# Patient Record
Sex: Male | Born: 1990 | Race: White | Hispanic: No | Marital: Single | Smoking: Never smoker
Health system: Southern US, Community
[De-identification: ages and names within clinical notes are randomized; demographics above are authoritative.]

## PROBLEM LIST (undated history)

## (undated) DIAGNOSIS — S1320XA Dislocation of unspecified parts of neck, initial encounter: Secondary | ICD-10-CM

## (undated) DIAGNOSIS — J45909 Unspecified asthma, uncomplicated: Secondary | ICD-10-CM

## (undated) DIAGNOSIS — F8081 Childhood onset fluency disorder: Secondary | ICD-10-CM

## (undated) HISTORY — PX: HERNIA REPAIR: SHX51

---

## 2012-11-03 ENCOUNTER — Encounter (HOSPITAL_BASED_OUTPATIENT_CLINIC_OR_DEPARTMENT_OTHER): Payer: Self-pay | Admitting: *Deleted

## 2012-11-03 ENCOUNTER — Emergency Department (HOSPITAL_BASED_OUTPATIENT_CLINIC_OR_DEPARTMENT_OTHER)
Admission: EM | Admit: 2012-11-03 | Discharge: 2012-11-03 | Disposition: A | Discharge status: Home / Self Care | Payer: Medicaid Other | Attending: Emergency Medicine | Admitting: Emergency Medicine

## 2012-11-03 ENCOUNTER — Emergency Department (HOSPITAL_BASED_OUTPATIENT_CLINIC_OR_DEPARTMENT_OTHER): Payer: Medicaid Other

## 2012-11-03 DIAGNOSIS — R42 Dizziness and giddiness: Secondary | ICD-10-CM | POA: Insufficient documentation

## 2012-11-03 DIAGNOSIS — R4789 Other speech disturbances: Secondary | ICD-10-CM | POA: Insufficient documentation

## 2012-11-03 DIAGNOSIS — Z880 Allergy status to penicillin: Secondary | ICD-10-CM | POA: Insufficient documentation

## 2012-11-03 DIAGNOSIS — J45909 Unspecified asthma, uncomplicated: Secondary | ICD-10-CM | POA: Insufficient documentation

## 2012-11-03 DIAGNOSIS — Z87828 Personal history of other (healed) physical injury and trauma: Secondary | ICD-10-CM | POA: Insufficient documentation

## 2012-11-03 DIAGNOSIS — Z8619 Personal history of other infectious and parasitic diseases: Secondary | ICD-10-CM | POA: Insufficient documentation

## 2012-11-03 DIAGNOSIS — M542 Cervicalgia: Secondary | ICD-10-CM | POA: Insufficient documentation

## 2012-11-03 DIAGNOSIS — Z79899 Other long term (current) drug therapy: Secondary | ICD-10-CM | POA: Insufficient documentation

## 2012-11-03 DIAGNOSIS — T675XXA Heat exhaustion, unspecified, initial encounter: Secondary | ICD-10-CM

## 2012-11-03 DIAGNOSIS — G43909 Migraine, unspecified, not intractable, without status migrainosus: Secondary | ICD-10-CM | POA: Insufficient documentation

## 2012-11-03 DIAGNOSIS — Y9389 Activity, other specified: Secondary | ICD-10-CM | POA: Insufficient documentation

## 2012-11-03 DIAGNOSIS — Y9289 Other specified places as the place of occurrence of the external cause: Secondary | ICD-10-CM | POA: Insufficient documentation

## 2012-11-03 DIAGNOSIS — R61 Generalized hyperhidrosis: Secondary | ICD-10-CM | POA: Insufficient documentation

## 2012-11-03 DIAGNOSIS — X30XXXA Exposure to excessive natural heat, initial encounter: Secondary | ICD-10-CM | POA: Insufficient documentation

## 2012-11-03 HISTORY — DX: Unspecified asthma, uncomplicated: J45.909

## 2012-11-03 HISTORY — DX: Childhood onset fluency disorder: F80.81

## 2012-11-03 HISTORY — DX: Dislocation of unspecified parts of neck, initial encounter: S13.20XA

## 2012-11-03 LAB — COMPREHENSIVE METABOLIC PANEL
ALT: 22 U/L (ref 0–53)
AST: 24 U/L (ref 0–37)
CO2: 27 mEq/L (ref 19–32)
Calcium: 10.6 mg/dL — ABNORMAL HIGH (ref 8.4–10.5)
Potassium: 3.3 mEq/L — ABNORMAL LOW (ref 3.5–5.1)
Sodium: 140 mEq/L (ref 135–145)
Total Protein: 8.6 g/dL — ABNORMAL HIGH (ref 6.0–8.3)

## 2012-11-03 LAB — URINE MICROSCOPIC-ADD ON

## 2012-11-03 LAB — URINALYSIS, ROUTINE W REFLEX MICROSCOPIC
Glucose, UA: NEGATIVE mg/dL
Hgb urine dipstick: NEGATIVE
Specific Gravity, Urine: 1.038 — ABNORMAL HIGH (ref 1.005–1.030)

## 2012-11-03 LAB — CBC WITH DIFFERENTIAL/PLATELET
Basophils Absolute: 0 10*3/uL (ref 0.0–0.1)
Eosinophils Absolute: 0.1 10*3/uL (ref 0.0–0.7)
Eosinophils Relative: 1 % (ref 0–5)
Lymphocytes Relative: 21 % (ref 12–46)
MCV: 88 fL (ref 78.0–100.0)
Neutrophils Relative %: 70 % (ref 43–77)
Platelets: 218 10*3/uL (ref 150–400)
RDW: 12.2 % (ref 11.5–15.5)
WBC: 7.2 10*3/uL (ref 4.0–10.5)

## 2012-11-03 MED ORDER — SODIUM CHLORIDE 0.9 % IV SOLN
INTRAVENOUS | Status: DC
  Administered 2012-11-03 (×2): via INTRAVENOUS

## 2012-11-03 MED ORDER — SODIUM CHLORIDE 0.9 % IV SOLN
1000.0000 mL | Freq: Once | INTRAVENOUS | Status: AC
  Administered 2012-11-03: 1000 mL via INTRAVENOUS

## 2012-11-03 NOTE — ED Notes (Signed)
Pt states he has been working outside today and ? Got too hot. +N/V. Hx neck dislocation 1 yr ago. Unable to keep PO's down.

## 2012-11-03 NOTE — ED Notes (Signed)
With vomitting and visual problems. Pt also reporting dehydration working outside. Pt last urinated today.

## 2012-11-03 NOTE — ED Provider Notes (Signed)
History     This chart was scribed for Allen Human, MD by Jiles Prows, ED Scribe. The patient was seen in room MH09/MH09 and the patient's care was started at 3:41 PM.  CSN: [id number] Arrival date & time 11/03/12  1421  Chief Complaint  Patient presents with  . Emesis   The history is provided by the patient and medical records. No language interpreter was used.   HPI Comments: Allen Hood is a 22 y.o. male with a h/o migraines, stuttering, asthma, and neck dislocation who presents to the Emergency Department complaining of sudden, moderate nausea and vomiting onset this afternoon.  He reports that he dislocated a vertebrae about a year ago in his neck.  He states that sometimes this still bothers him and it can cause headache, nausea, and vomiting.  He believes that this injury was aggravated today while working outside.  This was further aggravated by the heat.  His mother reports that he was vomiting and sweating profusely with associated dizziness and light headedness. Mother suspects dehydration.  Pt reportedly was provided with flexeril prescription on Friday but did not take it this weekend.  Mother reports he took this medication with a cheeseburger this afternoon after the episode began and was unable to keep it down.  She brought him to the ED after a few hours of this vomiting.  Pt and mother report he was staggering instead of walking straight after the vomiting incident.  Pt denies dysuria, rash, chest pain, headache, diaphoresis, fever, chills, diarrhea, weakness, cough, seizure, convulsion, SOB and any other pain.  Mother reports stuttering speech is baseline.  Mother reports that there has never been an x-ray or CT scan done on his neck. Pt reports he had a tick on his head 4 days ago and reports being treated for Lyme disease last year.  Pt reports h/o 2 hernia removal operations.  He denies smoking and alcohol use.  Pt has seen Dr. Daphane Shepherd at [facility] in  [place] for this issue. Pt reports 40 mg Relpax for migraine relief.  Mother states he uses two tablets to treat a migraine.  Past Medical History  Diagnosis Date  . Dislocation of neck   . Stuttering   . Asthma    Past Surgical History  Procedure Laterality Date  . Hernia repair     History reviewed. No pertinent family history. History  Substance Use Topics  . Smoking status: Never Smoker   . Smokeless tobacco: Not on file  . Alcohol Use: No    Review of Systems  HENT: Positive for neck pain.   Gastrointestinal: Positive for nausea and vomiting.  Neurological: Positive for dizziness and light-headedness.  All other systems reviewed and are negative.   Allergies  Peanuts; Penicillins; Shellfish allergy; and Suprax  Home Medications   Current Outpatient Rx  Name  Route  Sig  Dispense  Refill  . ALBUTEROL SULFATE HFA IN   Inhalation   Inhale into the lungs.         Marland Kitchen amphetamine-dextroamphetamine (ADDERALL XR) 30 MG 24 hr capsule   Oral   Take 30 mg by mouth every morning.         . cetirizine (ZYRTEC) 10 MG tablet   Oral   Take 10 mg by mouth daily.         . cyclobenzaprine (FLEXERIL) 10 MG tablet   Oral   Take 10 mg by mouth 3 (three) times daily as needed for muscle spasms.         Marland Kitchen  EPINEPHrine (EPIPEN IJ)   Injection   Inject as directed.         . montelukast (SINGULAIR) 10 MG tablet   Oral   Take 10 mg by mouth at bedtime.         Marland Kitchen omeprazole (PRILOSEC) 40 MG capsule   Oral   Take 40 mg by mouth daily.          BP 144/87  Pulse 98  Temp(Src) 98.7 F (37.1 C) (Oral)  Resp 20  Ht 6\' 3"  (1.905 m)  Wt 218 lb (98.884 kg)  BMI 27.25 kg/m2  SpO2 100%  Physical Exam  Nursing note and vitals reviewed. Constitutional: He is oriented to person, place, and time. He appears well-developed and well-nourished. No distress.  Notable stuttering speech which is baseline for him.  HENT:  Head: Normocephalic and atraumatic.  Tongue  dry.  Eyes: EOM are normal. Pupils are equal, round, and reactive to light.  Neck: Normal range of motion. Neck supple. No tracheal deviation present. No thyromegaly present.  Cardiovascular: Normal rate.  Exam reveals no gallop.   No murmur heard. Pulmonary/Chest: Effort normal and breath sounds normal. No respiratory distress.  Musculoskeletal: Normal range of motion.  Lymphadenopathy:    He has no cervical adenopathy.  Neurological: He is alert and oriented to person, place, and time.  Neurologically intact.  Skin: Skin is warm and dry.  Psychiatric: He has a normal mood and affect. His behavior is normal.   ED Course  Procedures (including critical care time) DIAGNOSTIC STUDIES: Oxygen Saturation is 100% on RA, normal by my interpretation.    COORDINATION OF CARE: 3:55 PM - Discussed ED treatment with pt at bedside including IV fluids, and x-ray of cervical spine and pt and family agree.  Discussed dehydration and low potassium in blood.  Labs Reviewed  COMPREHENSIVE METABOLIC PANEL - Abnormal; Notable for the following:    Potassium 3.3 (*)    Calcium 10.6 (*)    Total Protein 8.6 (*)    Albumin 5.4 (*)    GFR calc non Af Amer 86 (*)    All other components within normal limits  URINALYSIS, ROUTINE W REFLEX MICROSCOPIC - Abnormal; Notable for the following:    Color, Urine AMBER (*)    APPearance CLOUDY (*)    Specific Gravity, Urine 1.038 (*)    Bilirubin Urine SMALL (*)    Ketones, ur 15 (*)    Protein, ur 30 (*)    All other components within normal limits  URINE MICROSCOPIC-ADD ON - Abnormal; Notable for the following:    Bacteria, UA MANY (*)    Casts HYALINE CASTS (*)    Crystals CA OXALATE CRYSTALS (*)    All other components within normal limits  CBC WITH DIFFERENTIAL  LIPASE, BLOOD   Dg Cervical Spine Complete  11/03/2012   *RADIOLOGY REPORT*  Clinical Data: Neck pain and vomiting  CERVICAL SPINE - 4+ VIEWS  Comparison:  None  Findings:  There is no  evidence of cervical spine fracture or prevertebral soft tissue swelling.  Alignment is normal.  No other significant bone abnormalities are identified.  IMPRESSION: Negative cervical spine radiographs.   Original Report Authenticated By: Janeece Riggers, M.D.    Lab workup showed elevated specific gravity of urine, suggesting dehydration.  Labs otherwise good.  Cervical spine films were negative.  He received 3 liters of normal saline and feels better.  Released. Given discharge instructions for heat exhaustion.  1. Heat exhaustion, initial  encounter        Carleene Cooper III, MD 11/03/12 [phone]

## 2013-10-29 ENCOUNTER — Emergency Department (HOSPITAL_BASED_OUTPATIENT_CLINIC_OR_DEPARTMENT_OTHER)
Admission: EM | Admit: 2013-10-29 | Discharge: 2013-10-29 | Disposition: A | Discharge status: Home / Self Care | Payer: Medicaid Other | Attending: Emergency Medicine | Admitting: Emergency Medicine

## 2013-10-29 ENCOUNTER — Encounter (HOSPITAL_BASED_OUTPATIENT_CLINIC_OR_DEPARTMENT_OTHER): Payer: Self-pay | Admitting: Emergency Medicine

## 2013-10-29 ENCOUNTER — Emergency Department (HOSPITAL_BASED_OUTPATIENT_CLINIC_OR_DEPARTMENT_OTHER): Payer: Medicaid Other

## 2013-10-29 DIAGNOSIS — Z880 Allergy status to penicillin: Secondary | ICD-10-CM | POA: Diagnosis not present

## 2013-10-29 DIAGNOSIS — Z79899 Other long term (current) drug therapy: Secondary | ICD-10-CM | POA: Insufficient documentation

## 2013-10-29 DIAGNOSIS — R079 Chest pain, unspecified: Secondary | ICD-10-CM | POA: Diagnosis present

## 2013-10-29 DIAGNOSIS — Z8659 Personal history of other mental and behavioral disorders: Secondary | ICD-10-CM | POA: Diagnosis not present

## 2013-10-29 DIAGNOSIS — J45909 Unspecified asthma, uncomplicated: Secondary | ICD-10-CM | POA: Diagnosis not present

## 2013-10-29 DIAGNOSIS — Z87828 Personal history of other (healed) physical injury and trauma: Secondary | ICD-10-CM | POA: Diagnosis not present

## 2013-10-29 DIAGNOSIS — J4540 Moderate persistent asthma, uncomplicated: Secondary | ICD-10-CM

## 2013-10-29 LAB — CBC WITH DIFFERENTIAL/PLATELET
BASOS ABS: 0 10*3/uL (ref 0.0–0.1)
BASOS PCT: 0 % (ref 0–1)
Eosinophils Absolute: 0 10*3/uL (ref 0.0–0.7)
Eosinophils Relative: 0 % (ref 0–5)
HEMATOCRIT: 46.4 % (ref 39.0–52.0)
HEMOGLOBIN: 16.5 g/dL (ref 13.0–17.0)
LYMPHS PCT: 19 % (ref 12–46)
Lymphs Abs: 0.6 10*3/uL — ABNORMAL LOW (ref 0.7–4.0)
MCH: 31.4 pg (ref 26.0–34.0)
MCHC: 35.6 g/dL (ref 30.0–36.0)
MCV: 88.2 fL (ref 78.0–100.0)
MONO ABS: 0.3 10*3/uL (ref 0.1–1.0)
MONOS PCT: 11 % (ref 3–12)
NEUTROS ABS: 2.1 10*3/uL (ref 1.7–7.7)
NEUTROS PCT: 70 % (ref 43–77)
Platelets: 164 10*3/uL (ref 150–400)
RBC: 5.26 MIL/uL (ref 4.22–5.81)
RDW: 11.9 % (ref 11.5–15.5)
WBC: 3 10*3/uL — AB (ref 4.0–10.5)

## 2013-10-29 LAB — BASIC METABOLIC PANEL
ANION GAP: 16 — AB (ref 5–15)
BUN: 10 mg/dL (ref 6–23)
CHLORIDE: 101 meq/L (ref 96–112)
CO2: 23 meq/L (ref 19–32)
CREATININE: 1 mg/dL (ref 0.50–1.35)
Calcium: 9.5 mg/dL (ref 8.4–10.5)
GFR calc non Af Amer: >90 mL/min (ref >90)
Glucose, Bld: 112 mg/dL — ABNORMAL HIGH (ref 70–99)
POTASSIUM: 4.1 meq/L (ref 3.7–5.3)
Sodium: 140 mEq/L (ref 137–147)

## 2013-10-29 LAB — TROPONIN I: Troponin I: <0.3 ng/mL (ref <0.30)

## 2013-10-29 MED ORDER — ALBUTEROL SULFATE HFA 108 (90 BASE) MCG/ACT IN AERS: 1.0000 | INHALATION_SPRAY | Freq: Four times a day (QID) | RESPIRATORY_TRACT | Status: AC | PRN

## 2013-10-29 MED ORDER — PREDNISONE 50 MG PO TABS
60.0000 mg | ORAL_TABLET | Freq: Every day | ORAL | Status: DC
  Administered 2013-10-29: 60 mg via ORAL
  Filled 2013-10-29 (×2): qty 1

## 2013-10-29 MED ORDER — PREDNISONE 10 MG PO TABS: ORAL_TABLET | ORAL | Status: AC

## 2013-10-29 NOTE — ED Provider Notes (Signed)
CSN: [id number]     Arrival date & time 10/29/13  1439 History   First MD Initiated Contact with Patient 10/29/13 1502     Chief Complaint  Patient presents with  . Chest Pain     (Consider location/radiation/quality/duration/timing/severity/associated sxs/prior Treatment) Patient is a 23 y.o. male presenting with chest pain. The history is provided by the patient. No language interpreter was used.  Chest Pain Pain location:  L chest Pain quality: aching   Pain radiates to:  Does not radiate Pain radiates to the back: no   Pain severity:  Moderate Timing:  Constant Progression:  Worsening Chronicity:  New Relieved by:  Nothing Associated symptoms: nausea   Associated symptoms: no fever   Risk factors: male sex     Past Medical History  Diagnosis Date  . Dislocation of neck   . Stuttering   . Asthma    Past Surgical History  Procedure Laterality Date  . Hernia repair     No family history on file. History  Substance Use Topics  . Smoking status: Never Smoker   . Smokeless tobacco: Not on file  . Alcohol Use: No    Review of Systems  Constitutional: Negative for fever.  Cardiovascular: Positive for chest pain.  Gastrointestinal: Positive for nausea.      Allergies  Peanuts; Penicillins; Shellfish allergy; and Suprax  Home Medications   Prior to Admission medications   Medication Sig Start Date End Date Taking? Authorizing Provider  ALBUTEROL SULFATE HFA IN Inhale into the lungs.    Historical Provider, MD  cetirizine (ZYRTEC) 10 MG tablet Take 10 mg by mouth daily.    Historical Provider, MD  EPINEPHrine (EPIPEN IJ) Inject as directed.    Historical Provider, MD   BP 154/78  Pulse 110  Temp(Src) 99.9 F (37.7 C) (Oral)  Resp 22  Ht 6\' 3"  (1.905 m)  Wt 235 lb (106.595 kg)  BMI 29.37 kg/m2  SpO2 100% Physical Exam  Nursing note and vitals reviewed. Constitutional: He is oriented to person, place, and time. He appears well-developed and  well-nourished.  HENT:  Head: Normocephalic.  Eyes: EOM are normal.  Neck: Normal range of motion.  Pulmonary/Chest: Effort normal.  Abdominal: He exhibits distension.  Musculoskeletal: Normal range of motion.  Neurological: He is alert and oriented to person, place, and time.  Skin: Skin is warm.  Psychiatric: He has a normal mood and affect.    ED Course  Procedures (including critical care time) Labs Review Labs Reviewed  CBC WITH DIFFERENTIAL - Abnormal; Notable for the following:    WBC 3.0 (*)    Lymphs Abs 0.6 (*)    All other components within normal limits  BASIC METABOLIC PANEL - Abnormal; Notable for the following:    Glucose, Bld 112 (*)    Anion gap 16 (*)    All other components within normal limits  TROPONIN I    Imaging Review Dg Chest 2 View  10/29/2013   CLINICAL DATA:  Chest pain and pressure.  Shortness of breath.  EXAM: CHEST  2 VIEW  COMPARISON:  None.  FINDINGS: Cardiomediastinal silhouette unremarkable. Lungs clear. Bronchovascular markings normal. Pulmonary vascularity normal. No pneumothorax. No pleural effusions. Visualized bony thorax intact.  IMPRESSION: Normal examination.   Electronically Signed   By: Hulan Saashomas  Lawrence M.D.   On: 10/29/2013 16:14     EKG Interpretation   Date/Time:  Tuesday October 29 2013 14:48:43 EDT Ventricular Rate:  104 PR Interval:  154 QRS Duration:  88 QT Interval:  300 QTC Calculation: 394 R Axis:   71 Text Interpretation:  Sinus tachycardia Otherwise normal ECG No previous  ECGs available Confirmed by ZACKOWSKI  MD, SCOTT [phone]([id number]) on 10/29/2013  3:37:47 PM    labs normal.   i will put pt on prednisone for ashma.   Pt advised to follow up with primary care MD for recheck  MDM   Final diagnoses:  Asthma, moderate persistent, uncomplicated        Lonia SkinnerLeslie K FerrySofia, [place] JerseyPA-C 10/29/13 1638

## 2013-10-29 NOTE — ED Provider Notes (Signed)
Medical screening examination/treatment/procedure(s) were performed by non-physician practitioner and as supervising physician I was immediately available for consultation/collaboration.   EKG Interpretation   Date/Time:  Tuesday October 29 2013 14:48:43 EDT Ventricular Rate:  104 PR Interval:  154 QRS Duration: 88 QT Interval:  300 QTC Calculation: 394 R Axis:   71 Text Interpretation:  Sinus tachycardia Otherwise normal ECG No previous  ECGs available Confirmed by Timouthy Gilardi  MD, Chara Marquard ([id number]) on 10/29/2013  3:37:47 PM        Vanetta MuldersScott Jahden Schara, MD 10/29/13 2235

## 2013-10-29 NOTE — Discharge Instructions (Signed)
Asthma, Adult  Asthma is a condition of the lungs in which the airways tighten and narrow. Asthma can make it hard to breathe. Asthma cannot be cured, but medicine and lifestyle changes can help control it. Asthma may be started (triggered) by:  · Animal skin flakes (dander).  · Dust.  · Cockroaches.  · Pollen.  · Mold.  · Smoke.  · Cleaning products.  · Hair sprays or aerosol sprays.  · Paint fumes or strong smells.  · Cold air, weather changes, and winds.  · Crying or laughing hard.  · Stress.  · Certain medicines or drugs.  · Foods, such as dried fruit, potato chips, and sparkling grape juice.  · Infections or conditions (colds, flu).  · Exercise.  · Certain medical conditions or diseases.  · Exercise or tiring activities.  HOME CARE   · Take medicine as told by your doctor.  · Use a peak flow meter as told by your doctor. A peak flow meter is a tool that measures how well the lungs are working.  · Record and keep track of the peak flow meter's readings.  · Understand and use the asthma action plan. An asthma action plan is a written plan for taking care of your asthma and treating your attacks.  · To help prevent asthma attacks:  ¨ Do not smoke. Stay away from secondhand smoke.  ¨ Change your heating and air conditioning filter often.  ¨ Limit your use of fireplaces and wood stoves.  ¨ Get rid of pests (such as roaches and mice) and their droppings.  ¨ Throw away plants if you see mold on them.  ¨ Clean your floors. Dust regularly. Use cleaning products that do not smell.  ¨ Have someone vacuum when you are not home. Use a vacuum cleaner with a HEPA filter if possible.  ¨ Replace carpet with wood, tile, or vinyl flooring. Carpet can trap animal skin flakes and dust.  ¨ Use allergy-proof pillows, mattress covers, and box spring covers.  ¨ Wash bed sheets and blankets every week in hot water and dry them in a dryer.  ¨ Use blankets that are made of polyester or cotton.  ¨ Clean bathrooms and kitchens with bleach.  If possible, have someone repaint the walls in these rooms with mold-resistant paint. Keep out of the rooms that are being cleaned and painted.  ¨ Wash hands often.  GET HELP IF:  · You have make a whistling sound when breaking (wheeze), have shortness of breath, or have a cough even if taking medicine to prevent attacks.  · The colored mucus you cough up (sputum) is thicker than usual.  · The colored mucus you cough up changes from clear or white to yellow, green, gray, or bloody.  · You have problems from the medicine you are taking such as:  ¨ A rash.  ¨ Itching.  ¨ Swelling.  ¨ Trouble breathing.  · You need reliever medicines more than 2-3 times a week.  · Your peak flow measurement is still at 50-79% of your personal best after following the action plan for 1 hour.  GET HELP RIGHT AWAY IF:   · You seem to be worse and are not responding to medicine during an asthma attack.  · You are short of breath even at rest.  · You get short of breath when doing very little activity.  · You have trouble eating, drinking, or talking.  · You have chest pain.  · You have   a fast heartbeat.  · Your lips or fingernails start to turn blue.  · You are lightheaded, dizzy, or faint.  · Your peak flow is less than 50% of your personal best.  · You have a fever or lasting symptoms for more than 2-3 days.  · You have a fever and your symptoms suddenly get worse.  MAKE SURE YOU:   · Understand these instructions.  · Will watch your condition.  · Will get help right away if you are not doing well or get worse.  Document Released: 09/28/2007 Document Revised: 01/30/2013 Document Reviewed: 11/08/2012  [employer]® Patient Information ©2015 [employer]. This information is not intended to replace advice given to you by your health care provider. Make sure you discuss any questions you have with your health care provider.

## 2013-10-29 NOTE — ED Notes (Signed)
Pt reports working last night, feeling CP with SHOB. Reports dull, ache. Reports using inhaler helps. Sts he can't take a deep breath.

## 2014-02-01 ENCOUNTER — Emergency Department (HOSPITAL_BASED_OUTPATIENT_CLINIC_OR_DEPARTMENT_OTHER): Payer: Medicaid Other

## 2014-02-01 ENCOUNTER — Encounter (HOSPITAL_BASED_OUTPATIENT_CLINIC_OR_DEPARTMENT_OTHER): Payer: Self-pay | Admitting: Emergency Medicine

## 2014-02-01 ENCOUNTER — Emergency Department (HOSPITAL_BASED_OUTPATIENT_CLINIC_OR_DEPARTMENT_OTHER)
Admission: EM | Admit: 2014-02-01 | Discharge: 2014-02-01 | Disposition: A | Discharge status: Home / Self Care | Payer: Medicaid Other | Attending: Emergency Medicine | Admitting: Emergency Medicine

## 2014-02-01 DIAGNOSIS — Z87828 Personal history of other (healed) physical injury and trauma: Secondary | ICD-10-CM | POA: Insufficient documentation

## 2014-02-01 DIAGNOSIS — Z7952 Long term (current) use of systemic steroids: Secondary | ICD-10-CM | POA: Insufficient documentation

## 2014-02-01 DIAGNOSIS — Z8659 Personal history of other mental and behavioral disorders: Secondary | ICD-10-CM | POA: Diagnosis not present

## 2014-02-01 DIAGNOSIS — Y9289 Other specified places as the place of occurrence of the external cause: Secondary | ICD-10-CM | POA: Diagnosis not present

## 2014-02-01 DIAGNOSIS — Z79899 Other long term (current) drug therapy: Secondary | ICD-10-CM | POA: Insufficient documentation

## 2014-02-01 DIAGNOSIS — W208XXA Other cause of strike by thrown, projected or falling object, initial encounter: Secondary | ICD-10-CM | POA: Diagnosis not present

## 2014-02-01 DIAGNOSIS — S199XXA Unspecified injury of neck, initial encounter: Secondary | ICD-10-CM | POA: Diagnosis not present

## 2014-02-01 DIAGNOSIS — Y9389 Activity, other specified: Secondary | ICD-10-CM | POA: Diagnosis not present

## 2014-02-01 DIAGNOSIS — J45909 Unspecified asthma, uncomplicated: Secondary | ICD-10-CM | POA: Diagnosis not present

## 2014-02-01 DIAGNOSIS — S060X1A Concussion with loss of consciousness of 30 minutes or less, initial encounter: Secondary | ICD-10-CM | POA: Insufficient documentation

## 2014-02-01 DIAGNOSIS — Z880 Allergy status to penicillin: Secondary | ICD-10-CM | POA: Diagnosis not present

## 2014-02-01 DIAGNOSIS — S0001XA Abrasion of scalp, initial encounter: Secondary | ICD-10-CM | POA: Insufficient documentation

## 2014-02-01 DIAGNOSIS — S300XXA Contusion of lower back and pelvis, initial encounter: Secondary | ICD-10-CM | POA: Diagnosis not present

## 2014-02-01 DIAGNOSIS — S30810A Abrasion of lower back and pelvis, initial encounter: Secondary | ICD-10-CM | POA: Insufficient documentation

## 2014-02-01 DIAGNOSIS — S20229A Contusion of unspecified back wall of thorax, initial encounter: Secondary | ICD-10-CM

## 2014-02-01 DIAGNOSIS — S0990XA Unspecified injury of head, initial encounter: Secondary | ICD-10-CM | POA: Diagnosis present

## 2014-02-01 LAB — URINALYSIS, ROUTINE W REFLEX MICROSCOPIC
Bilirubin Urine: NEGATIVE
Glucose, UA: NEGATIVE mg/dL
Hgb urine dipstick: NEGATIVE
Ketones, ur: NEGATIVE mg/dL
Leukocytes, UA: NEGATIVE
NITRITE: NEGATIVE
PROTEIN: NEGATIVE mg/dL
SPECIFIC GRAVITY, URINE: 1.023 (ref 1.005–1.030)
Urobilinogen, UA: 1 mg/dL (ref 0.0–1.0)
pH: 7.5 (ref 5.0–8.0)

## 2014-02-01 MED ORDER — ONDANSETRON 8 MG PO TBDP
8.0000 mg | ORAL_TABLET | Freq: Once | ORAL | Status: AC
  Administered 2014-02-01: 8 mg via ORAL
  Filled 2014-02-01: qty 1

## 2014-02-01 NOTE — ED Notes (Signed)
Patient was helping cut trees, a ladder fell and patient states that the ladder fell on him and all he remembers is lying on the ground. States he vomited immediately after incident.

## 2014-02-01 NOTE — Discharge Instructions (Signed)
Tylenol as needed for any discomfort. Zofran for nausea as needed. Return here or go to be seen by your doctor for any new or worsening symptoms.   Concussion A concussion, or closed-head injury, is a brain injury caused by a direct blow to the head or by a quick and sudden movement (jolt) of the head or neck. Concussions are usually not life-threatening. Even so, the effects of a concussion can be serious. If you have had a concussion before, you are more likely to experience concussion-like symptoms after a direct blow to the head.  CAUSES  Direct blow to the head, such as from running into another player during a soccer game, being hit in a fight, or hitting your head on a hard surface.  A jolt of the head or neck that causes the brain to move back and forth inside the skull, such as in a car crash. SIGNS AND SYMPTOMS The signs of a concussion can be hard to notice. Early on, they may be missed by you, family members, and health care providers. You may look fine but act or feel differently. Symptoms are usually temporary, but they may last for days, weeks, or even longer. Some symptoms may appear right away while others may not show up for hours or days. Every head injury is different. Symptoms include:  Mild to moderate headaches that will not go away.  A feeling of pressure inside your head.  Having more trouble than usual:  Learning or remembering things you have heard.  Answering questions.  Paying attention or concentrating.  Organizing daily tasks.  Making decisions and solving problems.  Slowness in thinking, acting or reacting, speaking, or reading.  Getting lost or being easily confused.  Feeling tired all the time or lacking energy (fatigued).  Feeling drowsy.  Sleep disturbances.  Sleeping more than usual.  Sleeping less than usual.  Trouble falling asleep.  Trouble sleeping (insomnia).  Loss of balance or feeling lightheaded or dizzy.  Nausea or  vomiting.  Numbness or tingling.  Increased sensitivity to:  Sounds.  Lights.  Distractions.  Vision problems or eyes that tire easily.  Diminished sense of taste or smell.  Ringing in the ears.  Mood changes such as feeling sad or anxious.  Becoming easily irritated or angry for little or no reason.  Lack of motivation.  Seeing or hearing things other people do not see or hear (hallucinations). DIAGNOSIS Your health care provider can usually diagnose a concussion based on a description of your injury and symptoms. He or she will ask whether you passed out (lost consciousness) and whether you are having trouble remembering events that happened right before and during your injury. Your evaluation might include:  A brain scan to look for signs of injury to the brain. Even if the test shows no injury, you may still have a concussion.  Blood tests to be sure other problems are not present. TREATMENT  Concussions are usually treated in an emergency department, in urgent care, or at a clinic. You may need to stay in the hospital overnight for further treatment.  Tell your health care provider if you are taking any medicines, including prescription medicines, over-the-counter medicines, and natural remedies. Some medicines, such as blood thinners (anticoagulants) and aspirin, may increase the chance of complications. Also tell your health care provider whether you have had alcohol or are taking illegal drugs. This information may affect treatment.  Your health care provider will send you home with important instructions to follow.  How fast you will recover from a concussion depends on many factors. These factors include how severe your concussion is, what part of your brain was injured, your age, and how healthy you were before the concussion.  Most people with mild injuries recover fully. Recovery can take time. In general, recovery is slower in older persons. Also, persons who  have had a concussion in the past or have other medical problems may find that it takes longer to recover from their current injury. HOME CARE INSTRUCTIONS General Instructions  Carefully follow the directions your health care provider gave you.  Only take over-the-counter or prescription medicines for pain, discomfort, or fever as directed by your health care provider.  Take only those medicines that your health care provider has approved.  Do not drink alcohol until your health care provider says you are well enough to do so. Alcohol and certain other drugs may slow your recovery and can put you at risk of further injury.  If it is harder than usual to remember things, write them down.  If you are easily distracted, try to do one thing at a time. For example, do not try to watch TV while fixing dinner.  Talk with family members or close friends when making important decisions.  Keep all follow-up appointments. Repeated evaluation of your symptoms is recommended for your recovery.  Watch your symptoms and tell others to do the same. Complications sometimes occur after a concussion. Older adults with a brain injury may have a higher risk of serious complications, such as a blood clot on the brain.  Tell your teachers, school nurse, school counselor, coach, athletic trainer, or work Freight forwarder about your injury, symptoms, and restrictions. Tell them about what you can or cannot do. They should watch for:  Increased problems with attention or concentration.  Increased difficulty remembering or learning new information.  Increased time needed to complete tasks or assignments.  Increased irritability or decreased ability to cope with stress.  Increased symptoms.  Rest. Rest helps the brain to heal. Make sure you:  Get plenty of sleep at night. Avoid staying up late at night.  Keep the same bedtime hours on weekends and weekdays.  Rest during the day. Take daytime naps or rest breaks  when you feel tired.  Limit activities that require a lot of thought or concentration. These include:  Doing homework or job-related work.  Watching TV.  Working on the computer.  Avoid any situation where there is potential for another head injury (football, hockey, soccer, basketball, martial arts, downhill snow sports and horseback riding). Your condition will get worse every time you experience a concussion. You should avoid these activities until you are evaluated by the appropriate follow-up health care providers. Returning To Your Regular Activities You will need to return to your normal activities slowly, not all at once. You must give your body and brain enough time for recovery.  Do not return to sports or other athletic activities until your health care provider tells you it is safe to do so.  Ask your health care provider when you can drive, ride a bicycle, or operate heavy machinery. Your ability to react may be slower after a brain injury. Never do these activities if you are dizzy.  Ask your health care provider about when you can return to work or school. Preventing Another Concussion It is very important to avoid another brain injury, especially before you have recovered. In rare cases, another injury can lead to permanent  brain damage, brain swelling, or death. The risk of this is greatest during the first 7-10 days after a head injury. Avoid injuries by:  Wearing a seat belt when riding in a car.  Drinking alcohol only in moderation.  Wearing a helmet when biking, skiing, skateboarding, skating, or doing similar activities.  Avoiding activities that could lead to a second concussion, such as contact or recreational sports, until your health care provider says it is okay.  Taking safety measures in your home.  Remove clutter and tripping hazards from floors and stairways.  Use grab bars in bathrooms and handrails by stairs.  Place non-slip mats on floors and in  bathtubs.  Improve lighting in dim areas. SEEK MEDICAL CARE IF:  You have increased problems paying attention or concentrating.  You have increased difficulty remembering or learning new information.  You need more time to complete tasks or assignments than before.  You have increased irritability or decreased ability to cope with stress.  You have more symptoms than before. Seek medical care if you have any of the following symptoms for more than 2 weeks after your injury:  Lasting (chronic) headaches.  Dizziness or balance problems.  Nausea.  Vision problems.  Increased sensitivity to noise or light.  Depression or mood swings.  Anxiety or irritability.  Memory problems.  Difficulty concentrating or paying attention.  Sleep problems.  Feeling tired all the time. SEEK IMMEDIATE MEDICAL CARE IF:  You have severe or worsening headaches. These may be a sign of a blood clot in the brain.  You have weakness (even if only in one hand, leg, or part of the face).  You have numbness.  You have decreased coordination.  You vomit repeatedly.  You have increased sleepiness.  One pupil is larger than the other.  You have convulsions.  You have slurred speech.  You have increased confusion. This may be a sign of a blood clot in the brain.  You have increased restlessness, agitation, or irritability.  You are unable to recognize people or places.  You have neck pain.  It is difficult to wake you up.  You have unusual behavior changes.  You lose consciousness. MAKE SURE YOU:  Understand these instructions.  Will watch your condition.  Will get help right away if you are not doing well or get worse. Document Released: 07/02/2003 Document Revised: 04/16/2013 Document Reviewed: 11/01/2012 [facility] Patient Information 2015 [place], [place]. This information is not intended to replace advice given to you by your health care provider. Make sure you discuss any  questions you have with your health care provider.  Contusion A contusion is a deep bruise. Contusions are the result of an injury that caused bleeding under the skin. The contusion may turn blue, purple, or yellow. Minor injuries will give you a painless contusion, but more severe contusions may stay painful and swollen for a few weeks.  CAUSES  A contusion is usually caused by a blow, trauma, or direct force to an area of the body. SYMPTOMS   Swelling and redness of the injured area.  Bruising of the injured area.  Tenderness and soreness of the injured area.  Pain. DIAGNOSIS  The diagnosis can be made by taking a history and physical exam. An X-ray, CT scan, or MRI may be needed to determine if there were any associated injuries, such as fractures. TREATMENT  Specific treatment will depend on what area of the body was injured. In general, the best treatment for a contusion is  resting, icing, elevating, and applying cold compresses to the injured area. Over-the-counter medicines may also be recommended for pain control. Ask your caregiver what the best treatment is for your contusion. HOME CARE INSTRUCTIONS   Put ice on the injured area.  Put ice in a plastic bag.  Place a towel between your skin and the bag.  Leave the ice on for 15-20 minutes, 3-4 times a day, or as directed by your health care provider.  Only take over-the-counter or prescription medicines for pain, discomfort, or fever as directed by your caregiver. Your caregiver may recommend avoiding anti-inflammatory medicines (aspirin, ibuprofen, and naproxen) for 48 hours because these medicines may increase bruising.  Rest the injured area.  If possible, elevate the injured area to reduce swelling. SEEK IMMEDIATE MEDICAL CARE IF:   You have increased bruising or swelling.  You have pain that is getting worse.  Your swelling or pain is not relieved with medicines. MAKE SURE YOU:   Understand these  instructions.  Will watch your condition.  Will get help right away if you are not doing well or get worse. Document Released: 01/19/2005 Document Revised: 04/16/2013 Document Reviewed: 02/14/2011 [facility] Patient Information 2015 [place]. This information is not intended to replace advice given to you by your health care provider. Make sure you discuss any questions you have with your health care provider.

## 2014-02-01 NOTE — ED Notes (Signed)
Pt was holding ladder against a tree while another person was on ladder approx 20' cutting a limb- limb gave way and ladder fell and person on ladder dropped chainsaw- ?butt of chainsaw hit pt in head- also pt has abrasions across back- pt reports he was dazed, ?brief LOC, vomited immediately and 2 additional times after incident

## 2014-02-01 NOTE — ED Provider Notes (Signed)
Medical screening examination/treatment/procedure(s) were performed by non-physician practitioner and as supervising physician I was immediately available for consultation/collaboration.   EKG Interpretation None       Glynn OctaveStephen Prescott Truex, MD 02/01/14 [phone]

## 2014-02-01 NOTE — ED Provider Notes (Signed)
CSN: [id number]     Arrival date & time 02/01/14  1144 History   First MD Initiated Contact with Patient 02/01/14 1203     Chief Complaint  Patient presents with  . Back Injury     (Consider location/radiation/quality/duration/timing/severity/associated sxs/prior Treatment) Patient is a 23 y.o. male presenting with head injury. The history is provided by the patient. No language interpreter was used.  Head Injury Location:  R parietal Mechanism of injury: direct blow   Chronicity:  New Associated symptoms: headache, loss of consciousness, nausea, neck pain and vomiting   Associated symptoms: no blurred vision   Associated symptoms comment:  He was holding an extension ladder used for tree trimming while his co-worker was cutting limbs approximately 20 feet above him. The ladder became unstable and his co-worker dropped the chain saw he was using which hit the patient in the right side of his head. He states he does not remember anything else until he woke on the ground with the ladder across his back. He has since had multiple episodes of non-bloody emesis. No abdominal or chest pain.    Past Medical History  Diagnosis Date  . Dislocation of neck   . Stuttering   . Asthma    Past Surgical History  Procedure Laterality Date  . Hernia repair     No family history on file. History  Substance Use Topics  . Smoking status: Never Smoker   . Smokeless tobacco: Not on file  . Alcohol Use: No    Review of Systems  Constitutional: Negative for fever and diaphoresis.  HENT: Negative for dental problem and trouble swallowing.   Eyes: Negative for blurred vision and visual disturbance.  Respiratory: Negative for shortness of breath.   Cardiovascular: Negative for chest pain.  Gastrointestinal: Positive for nausea and vomiting. Negative for abdominal pain.  Genitourinary: Negative for dysuria.  Musculoskeletal: Positive for back pain and neck pain.  Skin: Positive for wound.   Abrasion to back.   Neurological: Positive for loss of consciousness, syncope and headaches. Negative for dizziness and speech difficulty.  Psychiatric/Behavioral: Negative for confusion.      Allergies  Peanuts; Penicillins; Shellfish allergy; and Suprax  Home Medications   Prior to Admission medications   Medication Sig Start Date End Date Taking? Authorizing Provider  albuterol (PROVENTIL HFA;VENTOLIN HFA) 108 (90 BASE) MCG/ACT inhaler Inhale 1-2 puffs into the lungs every 6 (six) hours as needed for wheezing or shortness of breath. 10/29/13   Elson AreasLeslie K Sofia, PA-C  ALBUTEROL SULFATE HFA IN Inhale into the lungs.    Historical Provider, MD  cetirizine (ZYRTEC) 10 MG tablet Take 10 mg by mouth daily.    Historical Provider, MD  EPINEPHrine (EPIPEN IJ) Inject as directed.    Historical Provider, MD  predniSONE (DELTASONE) 10 MG tablet 6,5,4,3,2,1 taper 10/29/13   Lonia SkinnerLeslie K Sofia, PA-C   BP 140/71  Pulse 73  Temp(Src) 98.2 F (36.8 C) (Oral)  Resp 15  Ht 6\' 3"  (1.905 m)  Wt 240 lb (108.863 kg)  BMI 30.00 kg/m2  SpO2 100% Physical Exam  Constitutional: He is oriented to person, place, and time. He appears well-developed and well-nourished.  HENT:  Head: Normocephalic.  Right Ear: External ear normal.  Left Ear: External ear normal.  No hemotympanum. Small scalp abrasion without hematoma to right parietal scalp.   Eyes: Conjunctivae and EOM are normal. Pupils are equal, round, and reactive to light.  Neck: Normal range of motion. Neck supple.  Cardiovascular: Normal rate  and regular rhythm.   No murmur heard. Pulmonary/Chest: Effort normal and breath sounds normal. He has no wheezes. He has no rales.  Abdominal: Soft. There is no tenderness.  Musculoskeletal: Normal range of motion. He exhibits no edema.  Right paracervical greater than midline cervical tenderness. FROM of neck without difficulty.   Neurological: He is alert and oriented to person, place, and time. He has normal  strength and normal reflexes. No sensory deficit. He exhibits normal muscle tone. He displays a negative Romberg sign. Coordination normal. GCS eye subscore is 4. GCS verbal subscore is 5. GCS motor subscore is 6.  CN's 3-12 intact. Ambulatory without imbalance.   Skin: Skin is warm and dry.  Linear abrasion to back extending from left thoracic to right flank area. No significant swelling. Mildly tender.   Psychiatric: He has a normal mood and affect.    ED Course  Procedures (including critical care time) Labs Review Labs Reviewed  URINALYSIS, ROUTINE W REFLEX MICROSCOPIC    Imaging Review No results found.   EKG Interpretation None      MDM   Final diagnoses:  None    1. Mild concussion 2. Contusion back  No further vomiting in ED. On re-evaluation, he remains alert, conversing with family at bedside, ambulatory to bathroom. No complaints. Suspect mild concussion with brief LOC given head trauma and vomiting but normal neurologic exam without deficits. Abrasion to back likely caused by falling ladder but no evidence rib injury, PTX or kidney injury. Stable for discharge home.    Arnoldo HookerShari A Elaynah Virginia, PA-C 02/01/14 1442

## 2015-06-14 IMAGING — CT CT CERVICAL SPINE W/O CM
4 of 5 series · 14 of 33 positions shown, 16 images · non-contrast
Comparison: 11/03/2012

CLINICAL DATA: Fell off ladder

EXAM:
CT HEAD WITHOUT CONTRAST
CT CERVICAL SPINE WITHOUT CONTRAST
TECHNIQUE: Multidetector CT imaging of the head and cervical spine was
performed following the standard protocol without intravenous
contrast. Multiplanar CT image reconstructions of the cervical spine
were also generated.

[Series 5: c_spine 2.0 b41s st · axial · 0.21mm/px · z∈[-311,-197]mm · 4 of 97 slices shown, 5 images]
[im 20/97  soft-tissue]
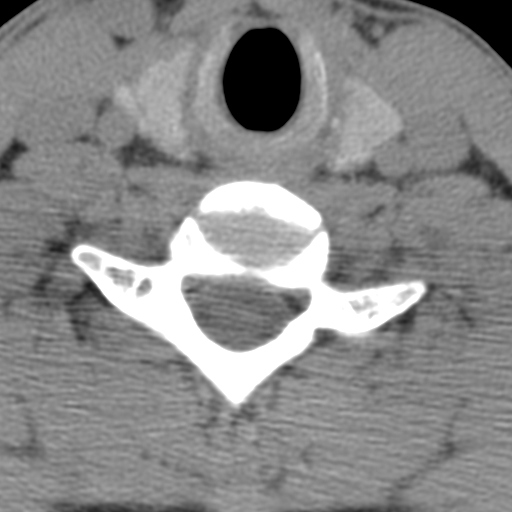
[im 20/97  bone]
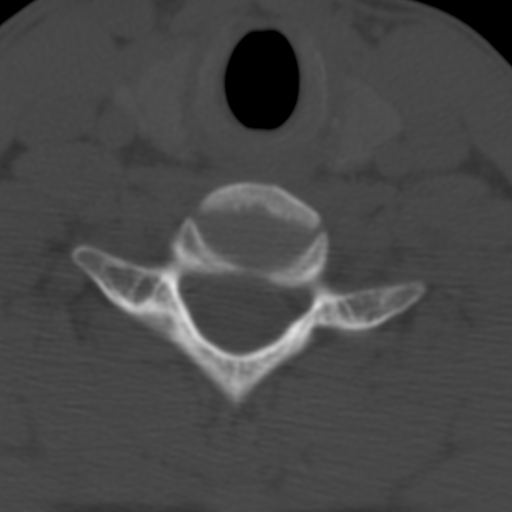
[im 39/97  bone]
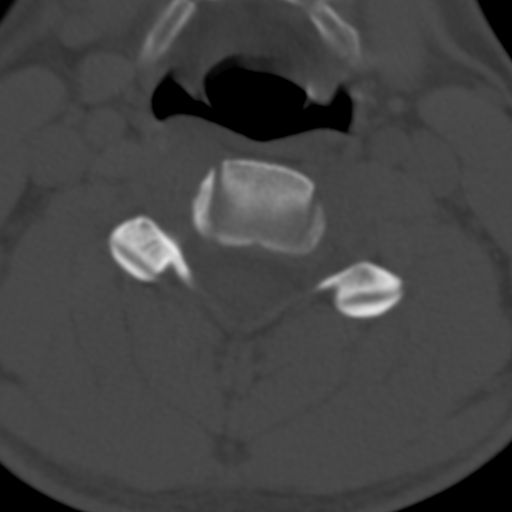
[im 58/97  bone]
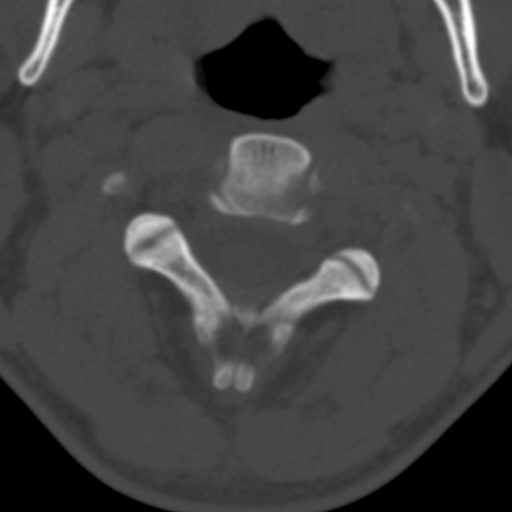
[im 77/97  bone]
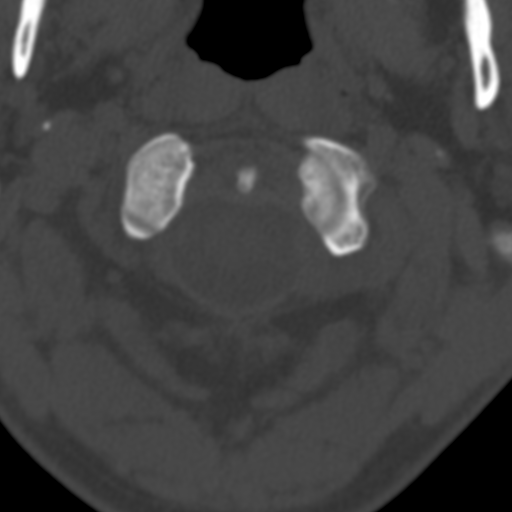

[Series 8: c_spine 2.0 coronal · coronal · 0.28mm/px · 3 of 36 slices shown]
[im 8/36  bone]
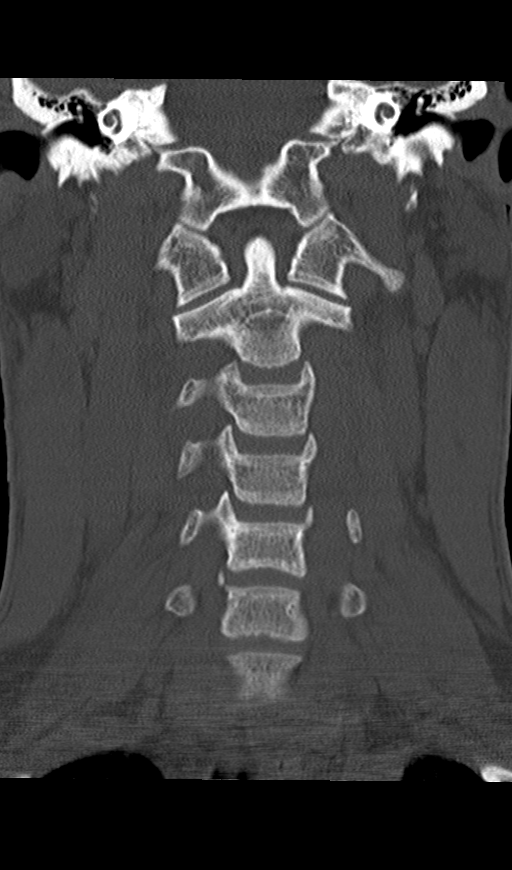
[im 15/36  bone]
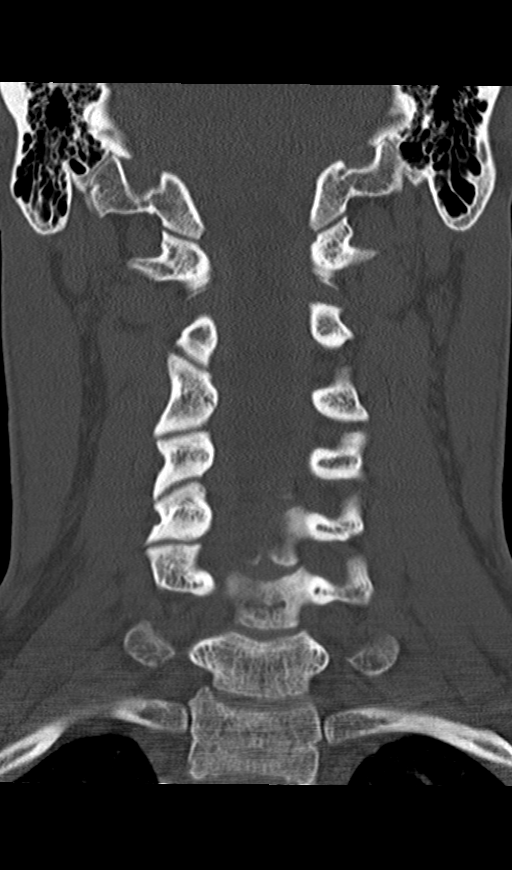
[im 22/36  bone]
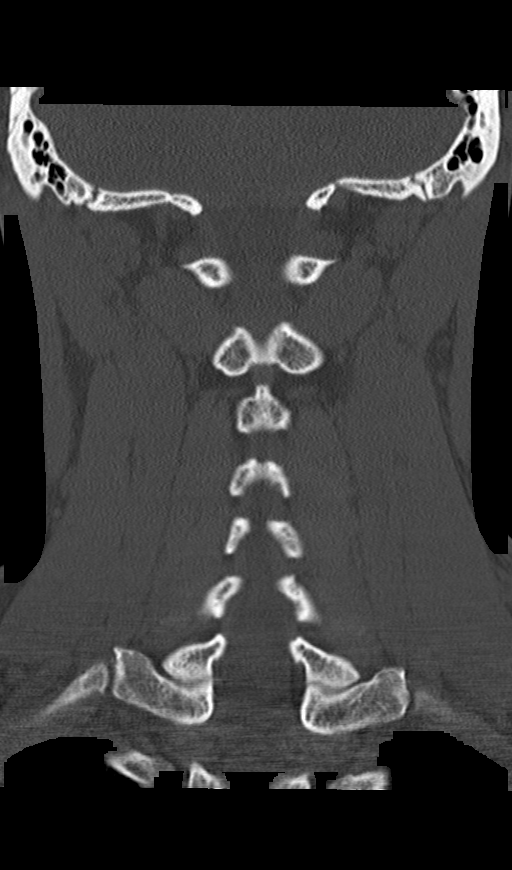

[Series 9: c_spine 2.0 sagittal · sagittal · 0.38mm/px · 5 of 43 slices shown, 6 images]
[im 15/43  bone]
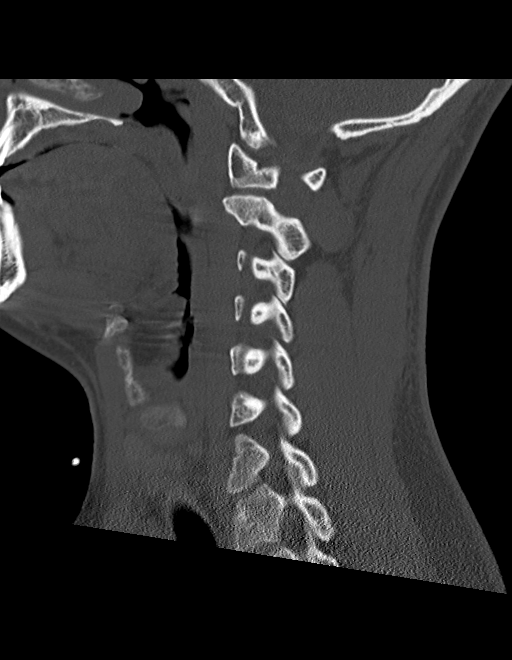
[im 18/43  bone]
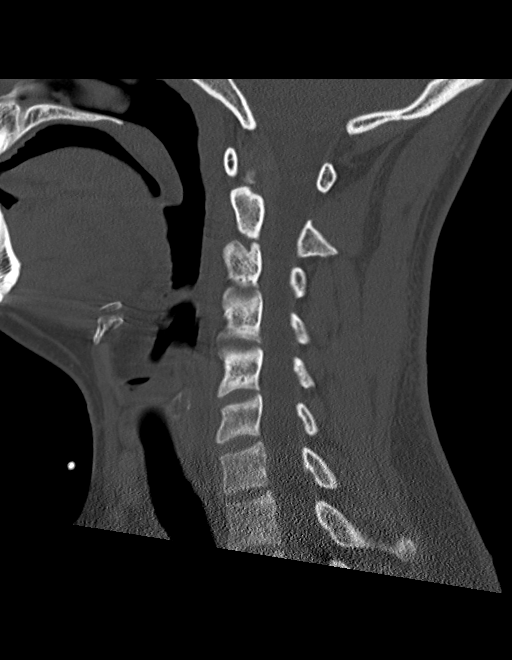
[im 22/43  soft-tissue]
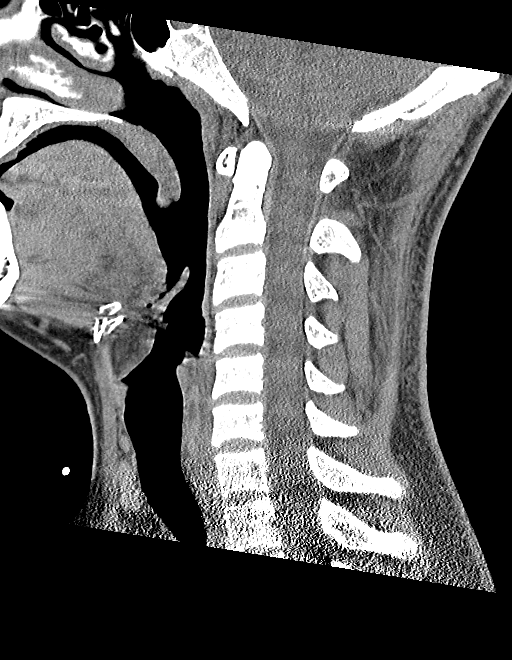
[im 22/43  bone]
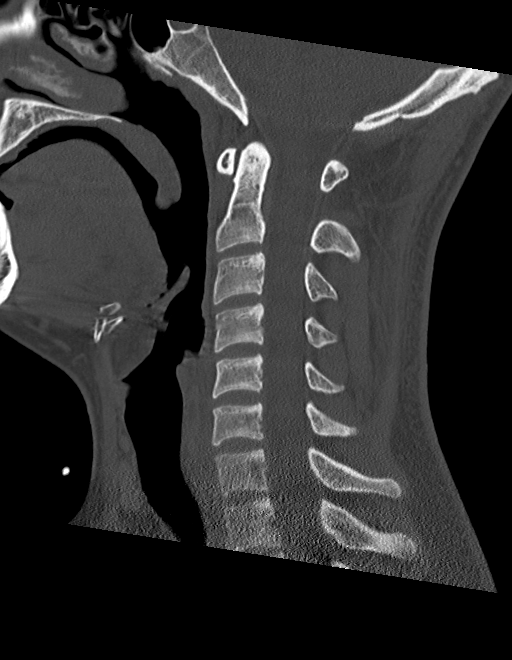
[im 25/43  bone]
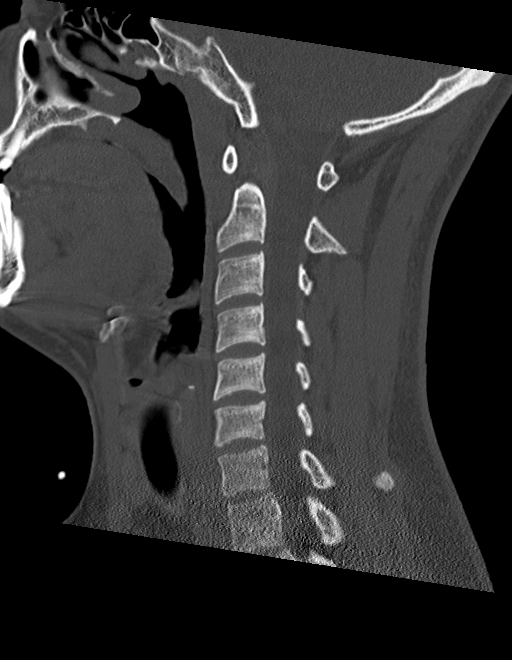
[im 29/43  bone]
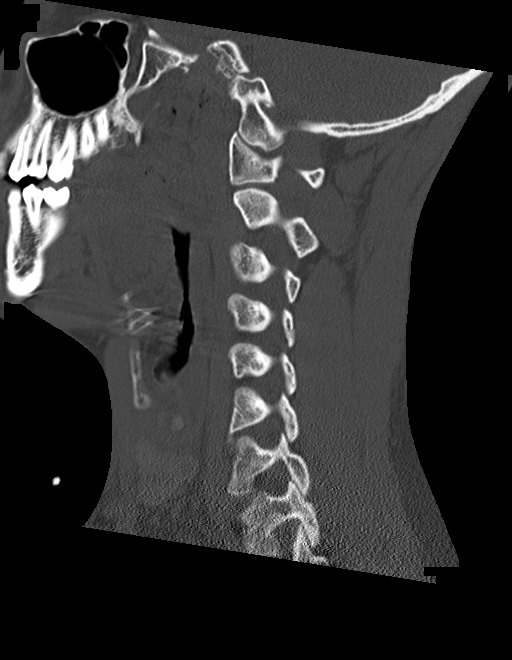

[Series 10: c_spine 2.0 orth ax · axial · 0.23mm/px · z∈[-326,-291]mm · 2 of 90 slices shown]
[im 18/90  bone]
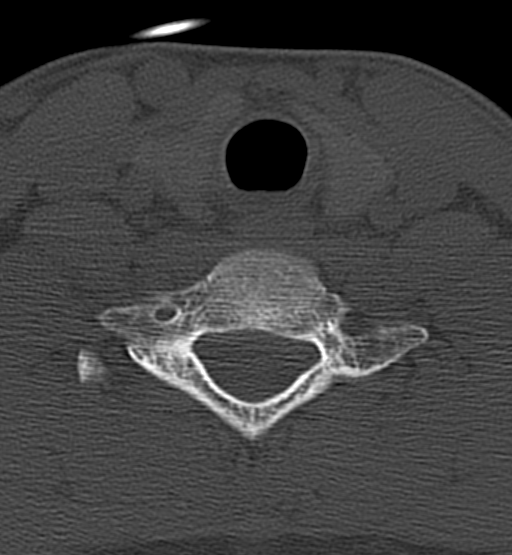
[im 36/90  bone]
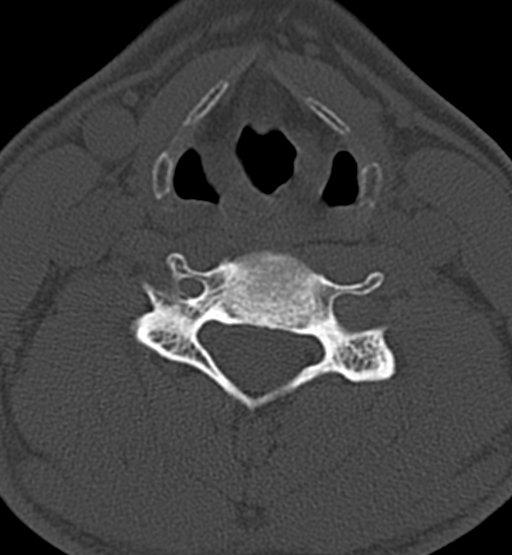

[14 of 33 positions shown; findings below may reference images not displayed]

FINDINGS: CT HEAD FINDINGS

No skull fracture is noted. Paranasal sinuses and mastoid air cells
are unremarkable. No intracranial hemorrhage, mass effect or midline
shift.

No hydrocephalus. The gray and white-matter differentiation is
preserved. No acute infarction. No mass lesion is noted on this
unenhanced scan.

CT CERVICAL SPINE FINDINGS

Axial images of the cervical spine shows no acute fracture or
subluxation. Computer processed images shows no acute fracture or
subluxation. Alignment, disc spaces and vertebral heights are
preserved. No prevertebral soft tissue swelling. Cervical airway is
patent.
IMPRESSION: 1. No acute intracranial abnormality.
2. No cervical spine acute fracture or subluxation.
